# Patient Record
Sex: Male | Born: 2000 | Race: Black or African American | Hispanic: No | Marital: Single | Smoking: Never smoker
Health system: Southern US, Community
[De-identification: ages and names within clinical notes are randomized; demographics above are authoritative.]

## PROBLEM LIST (undated history)

## (undated) DIAGNOSIS — S060XAA Concussion with loss of consciousness status unknown, initial encounter: Secondary | ICD-10-CM

## (undated) DIAGNOSIS — S060X9A Concussion with loss of consciousness of unspecified duration, initial encounter: Secondary | ICD-10-CM

## (undated) DIAGNOSIS — T7840XA Allergy, unspecified, initial encounter: Secondary | ICD-10-CM

## (undated) HISTORY — DX: Allergy, unspecified, initial encounter: T78.40XA

---
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
## 2021-05-25 ENCOUNTER — Encounter: Payer: Self-pay | Admitting: Emergency Medicine

## 2021-05-25 ENCOUNTER — Ambulatory Visit
Admission: EM | Admit: 2021-05-25 | Discharge: 2021-05-25 | Disposition: A | Discharge status: Home / Self Care | Payer: Managed Care, Other (non HMO) | Attending: Physician Assistant | Admitting: Physician Assistant

## 2021-05-25 ENCOUNTER — Other Ambulatory Visit: Payer: Self-pay

## 2021-05-25 DIAGNOSIS — J069 Acute upper respiratory infection, unspecified: Secondary | ICD-10-CM

## 2021-05-25 NOTE — ED Provider Notes (Signed)
[facility]    CSN: [id number] Arrival date & time: 05/25/21  0931      History   Chief Complaint Chief Complaint  Patient presents with   Nasal Congestion   Sore Throat    HPI Zachary Price is a 21 y.o. male.   Patient here today for evaluation of nasal congestion and sore throat that started 3 days ago.  He reports that he has had sore throat with yawning mainly.  He has not had any fever.  He notes some hoarse voice.  He denies any significant cough.  He has not had any nausea or vomiting.  The history is provided by the patient.  Sore Throat Pertinent negatives include no abdominal pain and no shortness of breath.   Past Medical History:  Diagnosis Date   Allergy    Claritin 10mg  daily.  s/p allergy testing: mold, pollen. Hicks.   Concussion     There are no problems to display for this patient.   History reviewed. No pertinent surgical history.     Home Medications    Prior to Admission medications   Medication Sig Start Date End Date Taking? Authorizing Provider  ibuprofen (ADVIL,MOTRIN) 800 MG tablet Take 1 tablet (800 mg total) by mouth every 8 (eight) hours as needed. 04/30/16   Lawyer, 05/02/16, PA-C  loratadine (CLARITIN) 10 MG tablet Take 10 mg by mouth daily.    [provider]    Family History History reviewed. No pertinent family history.  Social History Social History   Tobacco Use   Smoking status: Never   Smokeless tobacco: Never  Vaping Use   Vaping Use: Never used  Substance Use Topics   Alcohol use: No   Drug use: No     Allergies   Eggs or egg-derived products, Sulfa antibiotics, and Hepatitis b virus vaccines   Review of Systems Review of Systems  Constitutional:  Negative for chills and fever.  HENT:  Positive for congestion, sore throat and voice change. Negative for ear pain.   Eyes:  Negative for discharge and redness.  Respiratory:  Negative for cough and shortness of breath.    Gastrointestinal:  Negative for abdominal pain, nausea and vomiting.    Physical Exam Triage Vital Signs ED Triage Vitals  Enc Vitals Group     BP 05/25/21 1009 108/74     Pulse Rate 05/25/21 1009 92     Resp 05/25/21 1009 18     Temp 05/25/21 1009 98.7 F (37.1 C)     Temp Source 05/25/21 1009 Oral     SpO2 05/25/21 1009 98 %     Weight --      Height --      Head Circumference --      Peak Flow --      Pain Score 05/25/21 1010 4     Pain Loc --      Pain Edu? --      Excl. in GC? --    No data found.  Updated Vital Signs BP 108/74 (BP Location: Left Arm)    Pulse 92    Temp 98.7 F (37.1 C) (Oral)    Resp 18    SpO2 98%      Physical Exam Vitals and nursing note reviewed.  Constitutional:      General: He is not in acute distress.    Appearance: Normal appearance. He is not ill-appearing.  HENT:     Head: Normocephalic and atraumatic.  Nose: Nose normal. No congestion.     Mouth/Throat:     Mouth: Mucous membranes are moist.     Pharynx: Oropharynx is clear. Posterior oropharyngeal erythema present. No oropharyngeal exudate.  Eyes:     Conjunctiva/sclera: Conjunctivae normal.  Cardiovascular:     Rate and Rhythm: Normal rate and regular rhythm.     Heart sounds: Normal heart sounds. No murmur heard. Pulmonary:     Effort: Pulmonary effort is normal. No respiratory distress.     Breath sounds: Normal breath sounds. No wheezing, rhonchi or rales.  Skin:    General: Skin is warm and dry.  Neurological:     Mental Status: He is alert.  Psychiatric:        Mood and Affect: Mood normal.        Thought Content: Thought content normal.     UC Treatments / Results  Labs (all labs ordered are listed, but only abnormal results are displayed) Labs Reviewed  NOVEL CORONAVIRUS, NAA    EKG   Radiology No results found.  Procedures Procedures (including critical care time)  Medications Ordered in UC Medications - No data to display  Initial  Impression / Assessment and Plan / UC Course  I have reviewed the triage vital signs and the nursing notes.  Pertinent labs & imaging results that were available during my care of the patient were reviewed by me and considered in my medical decision making (see chart for details).    Suspect viral etiology of symptoms.  Will order COVID screening.  Recommend symptomatic treatment and increase fluids.  Final Clinical Impressions(s) / UC Diagnoses   Final diagnoses:  Acute upper respiratory infection   Discharge Instructions   None    ED Prescriptions   None    PDMP not reviewed this encounter.   Tomi Bamberger, PA-C 05/25/21 1040

## 2021-05-25 NOTE — ED Triage Notes (Signed)
Pt here for nasal congestion  and sore throat x 3 days 

## 2021-05-26 LAB — NOVEL CORONAVIRUS, NAA: SARS-CoV-2, NAA: NOT DETECTED

## 2021-07-26 ENCOUNTER — Encounter: Payer: Self-pay | Admitting: Emergency Medicine

## 2021-07-26 ENCOUNTER — Ambulatory Visit
Admission: EM | Admit: 2021-07-26 | Discharge: 2021-07-26 | Disposition: A | Discharge status: Home / Self Care | Payer: Managed Care, Other (non HMO) | Attending: Urgent Care | Admitting: Urgent Care

## 2021-07-26 ENCOUNTER — Other Ambulatory Visit: Payer: Self-pay

## 2021-07-26 DIAGNOSIS — J039 Acute tonsillitis, unspecified: Secondary | ICD-10-CM

## 2021-07-26 LAB — POCT MONO SCREEN (KUC): Mono, POC: NEGATIVE

## 2021-07-26 LAB — POCT RAPID STREP A (OFFICE): Rapid Strep A Screen: NEGATIVE

## 2021-07-26 MED ORDER — AMOXICILLIN-POT CLAVULANATE 875-125 MG PO TABS: 1.0000 | ORAL_TABLET | Freq: Two times a day (BID) | ORAL | 0 refills | Status: AC

## 2021-07-26 NOTE — Discharge Instructions (Addendum)
Your strep and monotest were negative.  We will send out a throat culture for confirmation. ?This looks like bacterial tonsillitis, therefore I recommend we start Augmentin which is an antibiotic.  Take this twice daily with food to prevent upset stomach.  Take a probiotic or yogurt daily to prevent softer stools. ?Monitor for worsening symptoms, fever, rash which would necessitate return to clinic. ?

## 2021-07-26 NOTE — ED Triage Notes (Signed)
Patient c/o sore throat x 3 days, difficulty swallowing while eating and drinking.  Patient has taken Claritin and Ibuprofen. ?

## 2021-07-27 ENCOUNTER — Encounter: Payer: Self-pay | Admitting: Urgent Care

## 2021-07-27 NOTE — ED Provider Notes (Signed)
?[facility] ? ? ? ?CSN: [id number] ?Arrival date & time: 07/26/21  5456 ? ? ?  ? ?History   ?Chief Complaint ?Chief Complaint  ?Patient presents with  ? Sore Throat  ? ? ?HPI ?Zachary Price is a 21 y.o. male.  ? ?The history is provided by the patient.  ?Sore Throat ?This is a new problem. The current episode started more than 2 days ago. The problem occurs constantly. The problem has been gradually worsening. Pertinent negatives include no chest pain, no abdominal pain, no headaches and no shortness of breath. The symptoms are aggravated by eating and drinking. Nothing relieves the symptoms. He has tried acetaminophen (ibuprofen and claritin) for the symptoms. The treatment provided no relief.  ? ?Past Medical History:  ?Diagnosis Date  ? Allergy   ? Claritin 10mg  daily.  s/p allergy testing: mold, pollen. Hicks.  ? Concussion   ? ? ?There are no problems to display for this patient. ? ? ?History reviewed. No pertinent surgical history. ? ? ? ? ?Home Medications   ? ?Prior to Admission medications   ?Medication Sig Start Date End Date Taking? Authorizing Provider  ?amoxicillin-clavulanate (AUGMENTIN) 875-125 MG tablet Take 1 tablet by mouth 2 (two) times daily for 10 days. 07/26/21 08/05/21 Yes Leonor Darnell L, PA  ?ibuprofen (ADVIL,MOTRIN) 800 MG tablet Take 1 tablet (800 mg total) by mouth every 8 (eight) hours as needed. 04/30/16  Yes Lawyer, 05/02/16, PA-C  ?loratadine (CLARITIN) 10 MG tablet Take 10 mg by mouth daily.   Yes [provider]  ? ? ?Family History ?History reviewed. No pertinent family history. ? ?Social History ?Social History  ? ?Tobacco Use  ? Smoking status: Never  ? Smokeless tobacco: Never  ?Vaping Use  ? Vaping Use: Never used  ?Substance Use Topics  ? Alcohol use: No  ? Drug use: No  ? ? ? ?Allergies   ?Eggs or egg-derived products, Sulfa antibiotics, and Hepatitis b virus vaccines ? ? ?Review of Systems ?Review of Systems  ?HENT:  Positive for sore throat.    ?Respiratory:  Negative for shortness of breath.   ?Cardiovascular:  Negative for chest pain.  ?Gastrointestinal:  Negative for abdominal pain.  ?Neurological:  Negative for headaches.  ?All other systems reviewed and are negative. ? ? ?Physical Exam ?Triage Vital Signs ?ED Triage Vitals  ?Enc Vitals Group  ?   BP 07/26/21 1101 123/77  ?   Pulse Rate 07/26/21 1101 97  ?   Resp 07/26/21 1101 18  ?   Temp 07/26/21 1101 98.9 ?F (37.2 ?C)  ?   Temp Source 07/26/21 1101 Oral  ?   SpO2 07/26/21 1101 99 %  ?   Weight 07/26/21 1102 159 lb (72.1 kg)  ?   Height 07/26/21 1102 6' (1.829 m)  ?   Head Circumference --   ?   Peak Flow --   ?   Pain Score 07/26/21 1102 7  ?   Pain Loc --   ?   Pain Edu? --   ?   Excl. in GC? --   ? ?No data found. ? ?Updated Vital Signs ?BP 123/77 (BP Location: Left Arm)   Pulse 97   Temp 98.9 ?F (37.2 ?C) (Oral)   Resp 18   Ht 6' (1.829 m)   Wt 159 lb (72.1 kg)   SpO2 99%   BMI 21.56 kg/m?  ? ?Visual Acuity ?Right Eye Distance:   ?Left Eye Distance:   ?Bilateral Distance:   ? ?  Right Eye Near:   ?Left Eye Near:    ?Bilateral Near:    ? ?Physical Exam ?Vitals and nursing note reviewed.  ?Constitutional:   ?   General: He is not in acute distress. ?   Appearance: He is well-developed and normal weight. He is not ill-appearing or toxic-appearing.  ?HENT:  ?   Head: Normocephalic and atraumatic.  ?   Right Ear: Tympanic membrane and ear canal normal. No drainage, swelling or tenderness. No middle ear effusion. Tympanic membrane is not erythematous.  ?   Left Ear: Tympanic membrane and ear canal normal. No drainage, swelling or tenderness.  No middle ear effusion. Tympanic membrane is not erythematous.  ?   Nose: No congestion or rhinorrhea.  ?   Mouth/Throat:  ?   Mouth: Mucous membranes are moist. No oral lesions.  ?   Pharynx: Uvula midline. Oropharyngeal exudate and posterior oropharyngeal erythema present. No pharyngeal swelling.  ?   Tonsils: Tonsillar exudate present. No tonsillar  abscesses.  ?Eyes:  ?   Conjunctiva/sclera: Conjunctivae normal.  ?   Pupils: Pupils are equal, round, and reactive to light.  ?Neck:  ?   Thyroid: No thyromegaly.  ?Cardiovascular:  ?   Rate and Rhythm: Normal rate and regular rhythm.  ?   Heart sounds: No murmur heard. ?  No gallop.  ?Pulmonary:  ?   Effort: Pulmonary effort is normal. No respiratory distress.  ?   Breath sounds: Normal breath sounds. No stridor. No wheezing, rhonchi or rales.  ?Chest:  ?   Chest wall: No tenderness.  ?Abdominal:  ?   General: Bowel sounds are normal. There is no distension.  ?   Palpations: Abdomen is soft. There is no mass.  ?   Tenderness: There is no abdominal tenderness. There is no rebound.  ?Musculoskeletal:  ?   Cervical back: Normal range of motion and neck supple.  ?Lymphadenopathy:  ?   Cervical: No cervical adenopathy.  ?Skin: ?   General: Skin is warm.  ?   Findings: No erythema or rash.  ?Neurological:  ?   General: No focal deficit present.  ?   Mental Status: He is alert and oriented to person, place, and time.  ?Psychiatric:     ?   Mood and Affect: Mood normal.     ?   Behavior: Behavior normal.  ? ? ? ?UC Treatments / Results  ?Labs ?(all labs ordered are listed, but only abnormal results are displayed) ?Labs Reviewed  ?CULTURE, GROUP A STREP [facility])  ?POCT RAPID STREP A (OFFICE)  ?POCT MONO SCREEN [facility])  ? ? ?EKG ? ? ?Radiology ?No results found. ? ?Procedures ?Procedures (including critical care time) ? ?Medications Ordered in UC ?Medications - No data to display ? ?Initial Impression / Assessment and Plan / UC Course  ?I have reviewed the triage vital signs and the nursing notes. ? ?Pertinent labs & imaging results that were available during my care of the patient were reviewed by me and considered in my medical decision making (see chart for details). ? ?  ? ?Acute tonsillitis - rapid strep and mono negative. Exudates noted, lymphadenopathy present. Will start augmentin, suspect bacterial cause. Throat cx for  confirmation ? ?Final Clinical Impressions(s) / UC Diagnoses  ? ?Final diagnoses:  ?Acute tonsillitis, unspecified etiology  ? ? ? ?Discharge Instructions   ? ?  ?Your strep and monotest were negative.  We will send out a throat culture for confirmation. ?This looks like bacterial tonsillitis,  therefore I recommend we start Augmentin which is an antibiotic.  Take this twice daily with food to prevent upset stomach.  Take a probiotic or yogurt daily to prevent softer stools. ?Monitor for worsening symptoms, fever, rash which would necessitate return to clinic. ? ? ?ED Prescriptions   ? ? Medication Sig Dispense Auth. Provider  ? amoxicillin-clavulanate (AUGMENTIN) 875-125 MG tablet Take 1 tablet by mouth 2 (two) times daily for 10 days. 20 tablet [place], [place] L, PA  ? ?  ? ?PDMP not reviewed this encounter. ?  Maretta Bees, [place] ?07/27/21 2327 ? ?

## 2021-07-28 LAB — CULTURE, GROUP A STREP (THRC)

## 2021-11-03 ENCOUNTER — Ambulatory Visit
Admission: EM | Admit: 2021-11-03 | Discharge: 2021-11-03 | Disposition: A | Discharge status: Home / Self Care | Payer: 59 | Attending: Physician Assistant | Admitting: Physician Assistant

## 2021-11-03 DIAGNOSIS — J029 Acute pharyngitis, unspecified: Secondary | ICD-10-CM | POA: Diagnosis not present

## 2021-11-03 LAB — POCT MONO SCREEN (KUC): Mono, POC: NEGATIVE

## 2021-11-03 LAB — POCT RAPID STREP A (OFFICE): Rapid Strep A Screen: NEGATIVE

## 2021-11-03 MED ORDER — ACETAMINOPHEN 325 MG PO TABS
975.0000 mg | ORAL_TABLET | Freq: Once | ORAL | Status: AC
  Administered 2021-11-03: 975 mg via ORAL

## 2021-11-03 MED ORDER — AMOXICILLIN 500 MG PO CAPS: 500.0000 mg | ORAL_CAPSULE | Freq: Two times a day (BID) | ORAL | 0 refills | Status: AC

## 2021-11-03 NOTE — ED Provider Notes (Signed)
[facility]    CSN: [id number] Arrival date & time: 11/03/21  1156      History   Chief Complaint Chief Complaint  Patient presents with   Headache   Sore Throat    HPI Zachary Price is a 21 y.o. male.   Patient here today for evaluation of headache and sore throat that is been ongoing for 2 days.  He also has a fever today.  He denies any nausea, vomiting or diarrhea.  He denies any cough or congestion.  He does not report treatment for symptoms.   Headache Associated symptoms: fever and sore throat   Associated symptoms: no abdominal pain, no congestion, no cough, no ear pain, no nausea and no vomiting   Sore Throat Associated symptoms include headaches. Pertinent negatives include no abdominal pain and no shortness of breath.    Past Medical History:  Diagnosis Date   Allergy    Claritin 10mg  daily.  s/p allergy testing: mold, pollen. Hicks.   Concussion     There are no problems to display for this patient.   History reviewed. No pertinent surgical history.     Home Medications    Prior to Admission medications   Medication Sig Start Date End Date Taking? Authorizing Provider  amoxicillin (AMOXIL) 500 MG capsule Take 1 capsule (500 mg total) by mouth 2 (two) times daily for 7 days. 11/03/21 11/10/21 Yes 01/10/22, PA-C  ibuprofen (ADVIL,MOTRIN) 800 MG tablet Take 1 tablet (800 mg total) by mouth every 8 (eight) hours as needed. 04/30/16   Lawyer, 05/02/16, PA-C  loratadine (CLARITIN) 10 MG tablet Take 10 mg by mouth daily.    [provider]    Family History Family History  Family history unknown: Yes    Social History Social History   Tobacco Use   Smoking status: Never   Smokeless tobacco: Never  Vaping Use   Vaping Use: Never used  Substance Use Topics   Alcohol use: No   Drug use: No     Allergies   Eggs or egg-derived products, Sulfa antibiotics, and Hepatitis b virus vaccines   Review of Systems Review  of Systems  Constitutional:  Positive for fever. Negative for chills.  HENT:  Positive for sore throat. Negative for congestion and ear pain.   Eyes:  Negative for discharge and redness.  Respiratory:  Negative for cough and shortness of breath.   Gastrointestinal:  Negative for abdominal pain, nausea and vomiting.  Neurological:  Positive for headaches.     Physical Exam Triage Vital Signs ED Triage Vitals  Enc Vitals Group     BP 11/03/21 1335 (!) 107/56     Pulse Rate 11/03/21 1335 (!) 128     Resp 11/03/21 1335 20     Temp 11/03/21 1335 (!) 102.8 F (39.3 C)     Temp Source 11/03/21 1335 Oral     SpO2 11/03/21 1335 96 %     Weight --      Height --      Head Circumference --      Peak Flow --      Pain Score 11/03/21 1338 8     Pain Loc --      Pain Edu? --      Excl. in GC? --    No data found.  Updated Vital Signs BP (!) 107/56 (BP Location: Left Arm)   Pulse (!) 128   Temp (!) 102.8 F (39.3 C) (Oral)  Resp 20   SpO2 96%      Physical Exam Vitals and nursing note reviewed.  Constitutional:      General: He is not in acute distress.    Appearance: Normal appearance. He is not ill-appearing.  HENT:     Head: Normocephalic and atraumatic.     Nose: Nose normal. No congestion.     Mouth/Throat:     Mouth: Mucous membranes are moist.     Pharynx: Oropharynx is clear. Posterior oropharyngeal erythema present. No oropharyngeal exudate.  Eyes:     Conjunctiva/sclera: Conjunctivae normal.  Cardiovascular:     Rate and Rhythm: Normal rate and regular rhythm.     Heart sounds: Normal heart sounds. No murmur heard. Pulmonary:     Effort: Pulmonary effort is normal. No respiratory distress.     Breath sounds: Normal breath sounds. No wheezing, rhonchi or rales.  Skin:    General: Skin is warm and dry.  Neurological:     Mental Status: He is alert.  Psychiatric:        Mood and Affect: Mood normal.        Thought Content: Thought content normal.       UC Treatments / Results  Labs (all labs ordered are listed, but only abnormal results are displayed) Labs Reviewed  CULTURE, GROUP A STREP [facility])  POCT RAPID STREP A (OFFICE)  POCT MONO SCREEN [facility])    EKG   Radiology No results found.  Procedures Procedures (including critical care time)  Medications Ordered in UC Medications  acetaminophen (TYLENOL) tablet 975 mg (975 mg Oral Given 11/03/21 1339)    Initial Impression / Assessment and Plan / UC Course  I have reviewed the triage vital signs and the nursing notes.  Pertinent labs & imaging results that were available during my care of the patient were reviewed by me and considered in my medical decision making (see chart for details).    Unknown etiology of pharyngitis however given high fever will treat with antibiotic therapy and recommended Tylenol or ibuprofen at home.  Mono screening and rapid strep negative in office.  Culture ordered.  Encouraged follow-up with any further concerns or the emergency department with any worsening symptoms.  Patient expresses understanding.  Final Clinical Impressions(s) / UC Diagnoses   Final diagnoses:  Acute pharyngitis, unspecified etiology   Discharge Instructions   None    ED Prescriptions     Medication Sig Dispense Auth. Provider   amoxicillin (AMOXIL) 500 MG capsule Take 1 capsule (500 mg total) by mouth 2 (two) times daily for 7 days. 14 capsule Tomi Bamberger, PA-C      PDMP not reviewed this encounter.   Tomi Bamberger, PA-C 11/03/21 1425

## 2021-11-03 NOTE — ED Triage Notes (Signed)
Pt presents with headache and sore throat X 2 days. 

## 2021-11-04 LAB — CULTURE, GROUP A STREP (THRC)
